# Patient Record
Sex: Female | Born: 2000 | Race: White | Hispanic: No | Marital: Single | Smoking: Never smoker
Health system: Southern US, Community
[De-identification: ages and names within clinical notes are randomized; demographics above are authoritative.]

## PROBLEM LIST (undated history)

## (undated) DIAGNOSIS — S060XAA Concussion with loss of consciousness status unknown, initial encounter: Secondary | ICD-10-CM

## (undated) DIAGNOSIS — S060X9A Concussion with loss of consciousness of unspecified duration, initial encounter: Secondary | ICD-10-CM

---
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
## 2021-09-12 ENCOUNTER — Telehealth: Payer: Self-pay

## 2021-09-12 NOTE — Telephone Encounter (Signed)
Nurse Assessment Nurse: Kizzie Bane, RN, Marylene Land Date/Time Lamount Cohen Time): 09/12/2021 12:24:36 PM Confirm and document reason for call. If symptomatic, describe symptoms. ---Caller states she has been having rectal bleeding for a week. No fever   Does the patient have any new or worsening symptoms? ---Yes Will a triage be completed? ---Yes Related visit to physician within the last 2 weeks? ---No Does the PT have any chronic conditions? (i.e. diabetes, asthma, this includes High risk factors for pregnancy, etc.) ---No Is the patient pregnant or possibly pregnant? (Ask all females between the ages of 87-55) ---No Is this a behavioral health or substance abuse call? ---No  Rectal Bleeding  [1] MODERATE rectal bleeding (small blood clots, passing blood without stool, or toilet water turns red)  [2] more than once a day Leanord Hawking 09/12/2021 12:25:58 PM  09/12/2021 12:28:05 PM Go to ED Now Yes Kizzie Bane, RN, Marylene Land

## 2021-09-13 NOTE — Telephone Encounter (Signed)
Fyi.

## 2021-10-04 ENCOUNTER — Encounter: Payer: Self-pay | Admitting: Family Medicine

## 2021-10-04 ENCOUNTER — Ambulatory Visit (INDEPENDENT_AMBULATORY_CARE_PROVIDER_SITE_OTHER): Payer: 59 | Admitting: Family Medicine

## 2021-10-04 ENCOUNTER — Other Ambulatory Visit: Payer: Self-pay

## 2021-10-04 VITALS — BP 120/68 | HR 87 | Temp 97.6°F | Ht 69.0 in | Wt 125.6 lb

## 2021-10-04 DIAGNOSIS — G8929 Other chronic pain: Secondary | ICD-10-CM | POA: Diagnosis not present

## 2021-10-04 DIAGNOSIS — R1010 Upper abdominal pain, unspecified: Secondary | ICD-10-CM

## 2021-10-04 DIAGNOSIS — F411 Generalized anxiety disorder: Secondary | ICD-10-CM | POA: Insufficient documentation

## 2021-10-04 DIAGNOSIS — K625 Hemorrhage of anus and rectum: Secondary | ICD-10-CM | POA: Diagnosis not present

## 2021-10-04 NOTE — Patient Instructions (Signed)
Please return in 3-6 months for your annual check up.   We will call you with information regarding your referral appointment. Gastroenterology consultation.  If you do not hear from [place] within the next 2 weeks, please let me know. It can take 1-2 weeks to get appointments set up with the specialists.    If you have any questions or concerns, please don't hesitate to send me a message via MyChart or call the office at [phone]. Thank you for visiting with [place] today! It's our pleasure caring for you.   Rectal Bleeding Rectal bleeding is when blood passes out of the opening between the buttocks (anus). People with rectal bleeding may notice bright red blood in their underwear or in the toilet after having a bowel movement. They may also have blood mixed with their stool (feces), or dark red or black stools. Rectal bleeding is usually a sign that something is wrong. Many things can cause rectal bleeding, including: Diverticulosis. This is a condition in which pockets or sacs project from the bowel. Hemorrhoids. These are blood vessels around the anus or inside the rectum that are larger than normal. Anal fissures. This is a tear in the anus. Proctitis and colitis. These are conditions in which the rectum, colon, or anus become inflamed. Polyps. These are growths that can be cancerous (malignant) or noncancerous (benign). Infections of the intestines. Fistulas. These are abnormal openings in the rectum and anus. Rectal prolapse. This is when a part of the rectum sticks out from the anus. Follow these instructions at home: Pay attention to any changes in your symptoms. Take these actions to help reduce bleeding and discomfort: Medicines Take over-the-counter and prescription medicines only as told by your health care provider. Ask your health care provider about changing or stopping your regular medicines or supplements. This is especially important if you are taking blood thinners. Medicines that  thin the blood can make rectal bleeding worse. Managing constipation Your condition may cause constipation. To prevent or treat constipation, or to help make your stools soft, you may need to: Drink enough fluid to keep your urine pale yellow. Take over-the-counter or prescription medicines. Eat foods that are high in fiber, such as beans, whole grains, and fresh fruits and vegetables. Ask your health care provider if you need a supplement to give you more fiber. Limit foods that are high in fat and processed sugars, such as fried or sweet foods.  General instructions Try not to strain when having a bowel movement. Try taking a warm bath. This may help to soothe any pain in your rectum. Keep all follow-up visits as told by your health care provider. This is important. Contact a health care provider if you: Have pain or tenderness in your abdomen. Have a fever. Have weakness. Have nausea. Cannot have a bowel movement. Get help right away if you have: New or increased rectal bleeding. Black or dark red stools. Vomit with blood or something that looks like coffee grounds. A fainting episode. Severe pain in your rectum. Summary Rectal bleeding is usually a sign that something is wrong. This condition should be evaluated by a health care provider. Eat a diet that is high in fiber. This will help keep your stools soft, making it easier to pass stools without straining. Medicines that thin the blood can make rectal bleeding worse. Get help right away if you have new or increased rectal bleeding, black or dark red stools, blood in your vomit, an episode of fainting, or severe  pain in your rectum. This information is not intended to replace advice given to you by your health care provider. Make sure you discuss any questions you have with your health care provider. Document Revised: 08/26/2019 Document Reviewed: 08/26/2019 Elsevier Patient Education  2022 [employer].

## 2021-10-04 NOTE — Progress Notes (Signed)
Subjective  CC:  Chief Complaint  Patient presents with   Rectal Bleeding    Started in November, went to ED 12/2. No blood in stool yesterday.   Abdominal Pain    Left side toward center and burns sensation inside.   Headache    Sharp pains , Left side    HPI: Laura Wilkins is a 20 y.o. female who presents to the office today to address the problems listed above in the chief complaint. 20 year old female here for rectal bleeding.  I reviewed ER evaluation from 12 June.  20 year old who has had problems with her stomach for several years, mostly describes pain with eating.  Has been a picky eater.  However over Thanksgiving holiday she had to days in a row with bright red rectal bleeding.  Some with wiping, some dripping into the toilet bowl.  Stool was soft and brown and normal.  That time.  She is not sure if she saw mucus in the bowl at that time.  She had no rectal pain.  She did have some crampy upper abdominal pain but this is not uncommon for her.  No fevers or chills.  No change in appetite.  No unwanted weight changes.  Emergency room evaluation was unremarkable except for an external hemorrhoid noted on exam that was nonbleeding.  CBC was normal.  Over the last several weeks she felt fine until yesterday when she again had bright rectal bleeding however, this time she reports blood was mixed in stool that was dark.  She admits to some crampy upper abdominal pain but no significant localized abdominal pain.  No fevers, chills, lightheadedness, palpitations.  No family history of inflammatory bowel disease.  She does suffer from chronic anxiety and is now on Wellbutrin.  No pain with defecation.  Assessment  1. Rectal bleeding   2. Chronic upper abdominal pain   3. GAD (generalized anxiety disorder)      Plan  Rectal bleeding: None patient with external hemorrhoid but anoscopy exam otherwise unremarkable.  Chronic upper abdominal pain but perhaps worse cramping recently.  Possible  mucoid stool but history is unreliable.  Given recurrent rectal bleeding, recommend GI referral for further evaluation.  Uncertain if warrants colonoscopy.  Reassured overall.  Benign abdominal exam.  Discussed what to do if symptoms worsen.  Supportive care in the interim. Chronic upper abdominal pain: Possibly gastritis.  Patient wishes to go across with gastroenterology.  No medical treatment at this time. General anxiety disorder per mental health clinic at [facility].  On Wellbutrin. Overdue for wellness exam.  Follow up: 3 months for complete physical Visit date not found  Orders Placed This Encounter  Procedures   Ambulatory referral to Gastroenterology   No orders of the defined types were placed in this encounter.     I reviewed the patients updated PMH, FH, and SocHx.    Patient Active Problem List   Diagnosis Date Noted   GAD (generalized anxiety disorder) 10/04/2021   Acne vulgaris 11/30/2018   Birth control counseling 11/30/2018   Depression, major, single episode, moderate (HCC) 11/30/2018   History of concussion 07/10/2018   Current Meds  Medication Sig   buPROPion (WELLBUTRIN XL) 150 MG 24 hr tablet Take 150 mg by mouth daily.   tretinoin (RETIN-A) 0.025 % gel Apply topically at bedtime.   TRI-SPRINTEC 0.18/0.215/0.25 MG-35 MCG tablet TAKE ONE TABLET BY MOUTH DAILY   triamcinolone cream (KENALOG) 0.1 % Apply 1 application topically 2 (two) times daily. For 2  weeks, then as needed    Allergies: Patient has No Known Allergies. Family History: Patient family history is not on file. Social History:  Patient  reports that she has never smoked. She has never used smokeless tobacco. She reports that she does not drink alcohol and does not use drugs.  Review of Systems: Constitutional: Negative for fever malaise or anorexia Cardiovascular: negative for chest pain Respiratory: negative for SOB or persistent cough Gastrointestinal: negative for abdominal pain  Objective   Vitals: BP 120/68    Pulse 87    Temp 97.6 F (36.4 C) (Temporal)    Ht 5\' 9"  (1.753 m)    Wt 125 lb 9.6 oz (57 kg)    LMP 09/25/2021 (Approximate)    SpO2 98%    BMI 18.55 kg/m  General: no acute distress , A&Ox3, thin HEENT: PEERL, conjunctiva normal, neck is supple Cardiovascular:  RRR without murmur or gallop.  Respiratory:  Good breath sounds bilaterally, CTAB with normal respiratory effort Gastrointestinal: soft, flat abdomen, normal active bowel sounds, no palpable masses, no hepatosplenomegaly, no appreciated hernias Midepigastric and left upper quadrant tenderness, right lower quadrant tenderness, no rebound or guarding or masses. Rectal exam: Normal sphincter tone, nonbleeding external hemorrhoid visualized, nontender, guaiac negative stool Anoscopy: Normal-appearing. Skin:  Warm, no rashes  Procedure note: Anoscopy External rectum visualized Anoscope gently inserted into the rectum and then withdrawn under visualization Patient tolerated procedure well  Commons side effects, risks, benefits, and alternatives for medications and treatment plan prescribed today were discussed, and the patient expressed understanding of the given instructions. Patient is instructed to call or message via MyChart if he/she has any questions or concerns regarding our treatment plan. No barriers to understanding were identified. We discussed Red Flag symptoms and signs in detail. Patient expressed understanding regarding what to do in case of urgent or emergency type symptoms.  Medication list was reconciled, printed and provided to the patient in AVS. Patient instructions and summary information was reviewed with the patient as documented in the AVS. This note was prepared with assistance of Dragon voice recognition software. Occasional wrong-word or sound-a-like substitutions may have occurred due to the inherent limitations of voice recognition software  This visit occurred during the SARS-CoV-2  public health emergency.  Safety protocols were in place, including screening questions prior to the visit, additional usage of staff PPE, and extensive cleaning of exam room while observing appropriate contact time as indicated for disinfecting solutions.

## 2021-10-06 ENCOUNTER — Emergency Department (HOSPITAL_COMMUNITY): Payer: 59

## 2021-10-06 ENCOUNTER — Encounter (HOSPITAL_COMMUNITY): Payer: Self-pay | Admitting: Emergency Medicine

## 2021-10-06 ENCOUNTER — Other Ambulatory Visit: Payer: Self-pay

## 2021-10-06 ENCOUNTER — Emergency Department (HOSPITAL_COMMUNITY)
Admission: EM | Admit: 2021-10-06 | Discharge: 2021-10-06 | Disposition: A | Discharge status: Home / Self Care | Payer: 59 | Attending: Emergency Medicine | Admitting: Emergency Medicine

## 2021-10-06 DIAGNOSIS — N200 Calculus of kidney: Secondary | ICD-10-CM | POA: Diagnosis not present

## 2021-10-06 DIAGNOSIS — R1011 Right upper quadrant pain: Secondary | ICD-10-CM | POA: Diagnosis present

## 2021-10-06 DIAGNOSIS — R109 Unspecified abdominal pain: Secondary | ICD-10-CM

## 2021-10-06 LAB — URINALYSIS, ROUTINE W REFLEX MICROSCOPIC
Bacteria, UA: NONE SEEN
Bilirubin Urine: NEGATIVE
Glucose, UA: NEGATIVE mg/dL
Ketones, ur: NEGATIVE mg/dL
Leukocytes,Ua: NEGATIVE
Nitrite: NEGATIVE
Protein, ur: NEGATIVE mg/dL
Specific Gravity, Urine: 1.01 (ref 1.005–1.030)
pH: 6 (ref 5.0–8.0)

## 2021-10-06 LAB — COMPREHENSIVE METABOLIC PANEL WITH GFR
ALT: 14 U/L (ref 0–44)
AST: 20 U/L (ref 15–41)
Albumin: 4.2 g/dL (ref 3.5–5.0)
Alkaline Phosphatase: 65 U/L (ref 38–126)
Anion gap: 10 (ref 5–15)
BUN: 11 mg/dL (ref 6–20)
CO2: 20 mmol/L — ABNORMAL LOW (ref 22–32)
Calcium: 9.3 mg/dL (ref 8.9–10.3)
Chloride: 107 mmol/L (ref 98–111)
Creatinine, Ser: 0.75 mg/dL (ref 0.44–1.00)
GFR, Estimated: >60 mL/min
Glucose, Bld: 96 mg/dL (ref 70–99)
Potassium: 3.5 mmol/L (ref 3.5–5.1)
Sodium: 137 mmol/L (ref 135–145)
Total Bilirubin: 1 mg/dL (ref 0.3–1.2)
Total Protein: 7.3 g/dL (ref 6.5–8.1)

## 2021-10-06 LAB — CBC WITH DIFFERENTIAL/PLATELET
Abs Immature Granulocytes: 0.02 K/uL (ref 0.00–0.07)
Basophils Absolute: 0 K/uL (ref 0.0–0.1)
Basophils Relative: 0 %
Eosinophils Absolute: 0.1 K/uL (ref 0.0–0.5)
Eosinophils Relative: 1 %
HCT: 40 % (ref 36.0–46.0)
Hemoglobin: 13.6 g/dL (ref 12.0–15.0)
Immature Granulocytes: 0 %
Lymphocytes Relative: 35 %
Lymphs Abs: 2.7 K/uL (ref 0.7–4.0)
MCH: 32.8 pg (ref 26.0–34.0)
MCHC: 34 g/dL (ref 30.0–36.0)
MCV: 96.4 fL (ref 80.0–100.0)
Monocytes Absolute: 0.7 K/uL (ref 0.1–1.0)
Monocytes Relative: 9 %
Neutro Abs: 4.3 K/uL (ref 1.7–7.7)
Neutrophils Relative %: 55 %
Platelets: 283 K/uL (ref 150–400)
RBC: 4.15 MIL/uL (ref 3.87–5.11)
RDW: 11.5 % (ref 11.5–15.5)
WBC: 7.8 K/uL (ref 4.0–10.5)
nRBC: 0 % (ref 0.0–0.2)

## 2021-10-06 LAB — LIPASE, BLOOD: Lipase: 45 U/L (ref 11–51)

## 2021-10-06 LAB — I-STAT BETA HCG BLOOD, ED (MC, WL, AP ONLY): I-stat hCG, quantitative: <5 m[IU]/mL

## 2021-10-06 MED ORDER — IOHEXOL 300 MG/ML  SOLN
80.0000 mL | Freq: Once | INTRAMUSCULAR | Status: AC | PRN
  Administered 2021-10-06: 04:00:00 80 mL via INTRAVENOUS

## 2021-10-06 NOTE — ED Triage Notes (Signed)
Patient reports bilateral low abdominal pain with bloody stools onset yesterday , no emesis or fever .

## 2021-10-06 NOTE — ED Provider Notes (Signed)
Emergency Medicine Provider Triage Evaluation Note  Laura Wilkins , a 20 y.o. female  was evaluated in triage.  Pt complains of lower abdominal pain.  Initially LLQ now RLQ with some bloody stools.  Has been ongoing for a while, worse now.  Seen at ED previously and had labs but refused CT.  Saw PCP yesterday, no work-up done.  Denies hx of IBS, crohn's, ulcerative colitis, etc.  Denies urinary symptoms, vaginal complaints..  Review of Systems  Positive: Lower abdominal pain, bloody stools Negative: fever  Physical Exam  BP (!) 156/112    Pulse (!) 121    Temp 98.1 F (36.7 C) (Oral)    Resp 18    LMP 09/25/2021 (Approximate)    SpO2 100%   Gen:   Awake, no distress   Resp:  Normal effort  MSK:   Moves extremities without difficulty  Other:  Lower abdominal pain   Medical Decision Making  Medically screening exam initiated at 12:39 AM.  Appropriate orders placed.  Samuel Rittenhouse was informed that the remainder of the evaluation will be completed by another provider, this initial triage assessment does not replace that evaluation, and the importance of remaining in the ED until their evaluation is complete.  Lower abdominal pain, bloody stools-- ongoing but worse now.  Will check labs, CT.   Garlon Hatchet, PA-C 10/06/21 0041    Marily Memos, MD 10/06/21 [phone]

## 2021-10-06 NOTE — ED Provider Notes (Signed)
[facility] EMERGENCY DEPARTMENT Provider Note   CSN: [id number] Arrival date & time: 10/06/21  0031     History Chief Complaint  Patient presents with   Abdominal Pain    Hematochezia    Brandice Busser is a 20 y.o. female.  HPI 20 year old female G59, LMP several days ago, periods are regular, presents today complaining of some ongoing left-sided pain followed by some more recent right-sided abdominal pain.  She reports some blood in her stools.  She also reports some pain with intercourse.  She denies abnormal vaginal discharge.  Has not had fever, chills, urinary tract and symptoms infection symptoms.    Past Medical History:  Diagnosis Date   Concussion     Patient Active Problem List   Diagnosis Date Noted   GAD (generalized anxiety disorder) 10/04/2021   Acne vulgaris 11/30/2018   Birth control counseling 11/30/2018   Depression, major, single episode, moderate ([place]) 11/30/2018   History of concussion 07/10/2018    History reviewed. No pertinent surgical history.   OB History   No obstetric history on file.     History reviewed. No pertinent family history.  Social History   Tobacco Use   Smoking status: Never   Smokeless tobacco: Never  Substance Use Topics   Alcohol use: No   Drug use: No    Home Medications Prior to Admission medications   Medication Sig Start Date End Date Taking? Authorizing Provider  buPROPion (WELLBUTRIN XL) 150 MG 24 hr tablet Take 150 mg by mouth daily.    [provider]  tretinoin (RETIN-A) 0.025 % gel Apply topically at bedtime. 02/29/20   Leamon Arnt, MD  TRI-SPRINTEC 0.18/0.215/0.25 MG-35 MCG tablet TAKE ONE TABLET BY MOUTH DAILY 10/12/20   Leamon Arnt, MD  triamcinolone cream (KENALOG) 0.1 % Apply 1 application topically 2 (two) times daily. For 2 weeks, then as needed 01/20/20   Leamon Arnt, MD    Allergies    Patient has no known allergies.  Review of Systems   Review of  Systems  All other systems reviewed and are negative.  Physical Exam Updated Vital Signs BP 112/64    Pulse (!) 52    Temp 98.1 F (36.7 C) (Oral)    Resp 15    Ht 1.753 m (_0 )    Wt 62 kg    LMP 09/25/2021 (Approximate)    SpO2 100%    BMI 20.18 kg/m   Physical Exam Vitals and nursing note reviewed.  Constitutional:      General: She is not in acute distress.    Appearance: She is well-developed.  HENT:     Head: Normocephalic.     Mouth/Throat:     Mouth: Mucous membranes are moist.  Eyes:     Extraocular Movements: Extraocular movements intact.  Cardiovascular:     Rate and Rhythm: Normal rate and regular rhythm.  Pulmonary:     Effort: Pulmonary effort is normal.     Breath sounds: Normal breath sounds.  Abdominal:     General: Bowel sounds are normal.     Tenderness: There is abdominal tenderness in the right upper quadrant and right lower quadrant. There is no right CVA tenderness, left CVA tenderness, guarding or rebound. Negative signs include Murphy's sign, Rovsing's sign and psoas sign.     Hernia: No hernia is present.  Skin:    Capillary Refill: Capillary refill takes less than 2 seconds.  Neurological:     General:  No focal deficit present.     Mental Status: She is alert.  Psychiatric:        Mood and Affect: Mood normal.        Behavior: Behavior normal.    ED Results / Procedures / Treatments   Labs (all labs ordered are listed, but only abnormal results are displayed) Labs Reviewed  COMPREHENSIVE METABOLIC PANEL - Abnormal; Notable for the following components:      Result Value   CO2 20 (*)    All other components within normal limits  URINALYSIS, ROUTINE W REFLEX MICROSCOPIC - Abnormal; Notable for the following components:   Hgb urine dipstick SMALL (*)    All other components within normal limits  CBC WITH DIFFERENTIAL/PLATELET  LIPASE, BLOOD  I-STAT BETA HCG BLOOD, ED ([facility], [facility], [facility] ONLY)    EKG None  Radiology CT ABDOMEN PELVIS W  CONTRAST  Result Date: 10/06/2021 CLINICAL DATA:  Left lower quadrant and right lower quadrant pain with bloody stools. EXAM: CT ABDOMEN AND PELVIS WITH CONTRAST TECHNIQUE: Multidetector CT imaging of the abdomen and pelvis was performed using the standard protocol following bolus administration of intravenous contrast. CONTRAST:  47m OMNIPAQUE IOHEXOL 300 MG/ML  SOLN COMPARISON:  None. FINDINGS: Lower chest:  No contributory findings. Hepatobiliary: No focal liver abnormality.No evidence of biliary obstruction or stone. Pancreas: Unremarkable. Spleen: Unremarkable. Adrenals/Urinary Tract: Negative adrenals. No hydronephrosis or ureteral stone. 3 mm right renal calculus. Unremarkable bladder. Stomach/Bowel: No obstruction. No appendicitis or other visible bowel inflammation. Vascular/Lymphatic: No acute vascular abnormality. No mass or adenopathy. Reproductive:No pathologic findings. Other: No ascites or pneumoperitoneum. Musculoskeletal: No acute abnormalities. IMPRESSION: No acute finding. 3 mm right renal calculus. Electronically Signed   By: JJorje GuildM.D.   On: 10/06/2021 05:01    Procedures Procedures   Medications Ordered in ED Medications  iohexol (OMNIPAQUE) 300 MG/ML solution 80 mL (80 mLs Intravenous Contrast Given 10/06/21 0429)    ED Course  I have reviewed the triage vital signs and the nursing notes.  Pertinent labs & imaging results that were available during my care of the patient were reviewed by me and considered in my medical decision making (see chart for details).    MDM Rules/Calculators/A&P                         20year old female with mild diffuse abdominal pain with differential diagnosis that includes kidney disease, infection, kidney stones, gallbladder, appendicitis, diseases of the female reproductive system, diseases of the stomach, pancreas, and bowel.  Patient was evaluated here with labs.  Labs were ordered prior to my evaluation.  I did review and  interpret labs.  CBC is within normal limits C-Met was within normal limits the exception of mild decreased CO2 on initial evaluation.  Chemistries attic, electrolytes, LFTs, lipase, and liver functions all are normal Urinalysis is significant for small hemoglobin with 0-5 red blood cells and 0-5 white blood cells with no bacteria nitrate negative Pregnancy test is negative CT was personally reviewed and there is no evidence of acute intra-abdominal pathology with the exception of a right millimeter renal calculus. Discussed all the above with patient and her mother Possible etiologies for her current pain are ureteral stone, intermittent obstruction from the right renal stone, or other diseases of the uterus and adnexa, colon, such as bowel syndrome or inflammatory bowel disease. Given patient is sexually active and has irregular periods, plan referral to GYN Patient has primary care doctor  and she is advised regarding follow-up. Given the patient has a kidney stone.  Discussed signs symptoms of kidney stone formation.  And given her age with the microcytic hematuria, will refer to urology. We discursive discussed return precautions and need for follow-up and she and her mother voiced understanding.   Final Clinical Impression(s) / ED Diagnoses Final diagnoses:  Abdominal pain, unspecified abdominal location  Kidney stone    Rx / DC Orders ED Discharge Orders     None        Pattricia Boss, MD 10/06/21 [phone]

## 2021-10-06 NOTE — Discharge Instructions (Signed)
Please follow-up with your primary care doctor, urology, and gynecology. Return if you are having worsening pain or other new symptoms. Your CT scan and labs here today were all essentially normal with the exception of the kidney stone that was seen.

## 2022-02-15 ENCOUNTER — Encounter: Payer: Self-pay | Admitting: Family Medicine

## 2022-02-21 ENCOUNTER — Ambulatory Visit (INDEPENDENT_AMBULATORY_CARE_PROVIDER_SITE_OTHER): Payer: Commercial Managed Care - HMO | Admitting: Family Medicine

## 2022-02-21 ENCOUNTER — Encounter: Payer: Self-pay | Admitting: Family Medicine

## 2022-02-21 VITALS — BP 100/76 | HR 102 | Temp 98.8°F | Ht 69.0 in | Wt 125.2 lb

## 2022-02-21 DIAGNOSIS — I7300 Raynaud's syndrome without gangrene: Secondary | ICD-10-CM | POA: Diagnosis not present

## 2022-02-21 DIAGNOSIS — F988 Other specified behavioral and emotional disorders with onset usually occurring in childhood and adolescence: Secondary | ICD-10-CM

## 2022-02-21 DIAGNOSIS — F411 Generalized anxiety disorder: Secondary | ICD-10-CM

## 2022-02-21 DIAGNOSIS — R1010 Upper abdominal pain, unspecified: Secondary | ICD-10-CM

## 2022-02-21 DIAGNOSIS — N200 Calculus of kidney: Secondary | ICD-10-CM | POA: Diagnosis not present

## 2022-02-21 DIAGNOSIS — G8929 Other chronic pain: Secondary | ICD-10-CM

## 2022-02-21 LAB — CBC WITH DIFFERENTIAL/PLATELET
Basophils Absolute: 0 10*3/uL (ref 0.0–0.1)
Basophils Relative: 0.7 % (ref 0.0–3.0)
Eosinophils Absolute: 0 10*3/uL (ref 0.0–0.7)
Eosinophils Relative: 0.7 % (ref 0.0–5.0)
HCT: 41.9 % (ref 36.0–49.0)
Hemoglobin: 13.9 g/dL (ref 12.0–16.0)
Lymphocytes Relative: 33.3 % (ref 24.0–48.0)
Lymphs Abs: 1.8 10*3/uL (ref 0.7–4.0)
MCHC: 33.2 g/dL (ref 31.0–37.0)
MCV: 97.5 fl (ref 78.0–98.0)
Monocytes Absolute: 0.4 10*3/uL (ref 0.1–1.0)
Monocytes Relative: 7.3 % (ref 3.0–12.0)
Neutro Abs: 3.1 10*3/uL (ref 1.4–7.7)
Neutrophils Relative %: 58 % (ref 43.0–71.0)
Platelets: 259 10*3/uL (ref 150.0–575.0)
RBC: 4.3 Mil/uL (ref 3.80–5.70)
RDW: 12 % (ref 11.4–15.5)
WBC: 5.3 10*3/uL (ref 4.5–13.5)

## 2022-02-21 LAB — COMPREHENSIVE METABOLIC PANEL
ALT: 10 U/L (ref 0–35)
AST: 17 U/L (ref 0–37)
Albumin: 4.1 g/dL (ref 3.5–5.2)
Alkaline Phosphatase: 52 U/L (ref 47–119)
BUN: 12 mg/dL (ref 6–23)
CO2: 23 mEq/L (ref 19–32)
Calcium: 10 mg/dL (ref 8.4–10.5)
Chloride: 104 mEq/L (ref 96–112)
Creatinine, Ser: 0.81 mg/dL (ref 0.40–1.20)
GFR: 104.94 mL/min (ref >60.00)
Glucose, Bld: 91 mg/dL (ref 70–99)
Potassium: 5.1 mEq/L (ref 3.5–5.1)
Sodium: 136 mEq/L (ref 135–145)
Total Bilirubin: 0.8 mg/dL (ref 0.2–1.2)
Total Protein: 7.7 g/dL (ref 6.0–8.3)

## 2022-02-21 LAB — TSH: TSH: 1.67 u[IU]/mL (ref 0.40–5.00)

## 2022-02-21 LAB — SEDIMENTATION RATE: Sed Rate: 15 mm/hr (ref 0–20)

## 2022-02-21 LAB — C-REACTIVE PROTEIN: CRP: <1 mg/dL (ref 0.5–20.0)

## 2022-02-21 NOTE — Patient Instructions (Addendum)
Please contact [facility] to schedule a consult for persistent abdominal pain.  ?[phone] ? ?Continue care with your psychiatrist and start counseling as recommended.  ? ?Please sign a release of records for the [facility] records OR send the copies you have in your mychart or print them out for my review.  ? ?If you have any questions or concerns, please don't hesitate to send me a message via MyChart or call the office at [phone]. Thank you for visiting with [place] today! It's our pleasure caring for you.  ? ?Raynaud's Phenomenon ? ?Raynaud's phenomenon is a condition that affects the blood vessels (arteries) that carry blood to the fingers and toes. The arteries that supply blood to the ears, lips, nipples, or the tip of the nose might also be affected. Raynaud's phenomenon causes the arteries to become narrow temporarily (spasm). As a result, the flow of blood to the affected areas is temporarily decreased. This usually occurs in response to cold temperatures or stress. During an attack, the skin in the affected areas turns white, then blue, and finally red. A person may also feel tingling or numbness in those areas. ?Attacks usually last for only a brief period, and then the blood flow to the area returns to normal. In most cases, Raynaud's phenomenon does not cause serious health problems. ?What are the causes? ?In many cases, the cause of this condition is not known. The condition may occur on its own (primary Raynaud's phenomenon) or may be associated with other diseases or factors (secondary Raynaud's phenomenon). ?Possible causes may include: ?Diseases or medical conditions that damage the arteries. ?Injuries and repetitive actions that hurt the hands or feet. ?Being exposed to certain chemicals. ?Taking medicines that narrow the arteries. ?Other medical conditions, such as lupus, scleroderma, rheumatoid arthritis, thyroid problems, blood disorders, Sjogren syndrome, or atherosclerosis. ?What increases the  risk? ?The following factors may make you more likely to develop this condition: ?Being 71-28 years old. ?Being female. ?Having a family history of Raynaud's phenomenon. ?Living in a cold climate. ?Smoking. ?What are the signs or symptoms? ?Symptoms of this condition usually occur when you are exposed to cold temperatures or when you have emotional stress. The symptoms may last for a few minutes or up to several hours. They usually affect your fingers but may also affect your toes, nipples, lips, ears, or the tip of your nose. Symptoms may include: ?Changes in skin color. The skin in the affected areas will turn pale or white. The skin may then change from white to bluish to red as normal blood flow returns to the area. ?Numbness, tingling, or pain in the affected areas. ?In severe cases, symptoms may include: ?Skin sores. ?Tissues decaying and dying (gangrene). ?How is this diagnosed? ?This condition may be diagnosed based on: ?Your symptoms and medical history. ?A physical exam. During the exam, you may be asked to put your hands in cold water to check for a reaction to cold temperature. ?Tests, such as: ?Blood tests to check for other diseases or conditions. ?A test to check the movement of blood through your arteries and veins (vascular ultrasound). ?A test in which the skin at the base of your fingernail is examined under a microscope (nailfold capillaroscopy). ?How is this treated? ?During an episode, you can take actions to help symptoms go away faster. Options include moving your arms around in a windmill pattern, warming your fingers under warm water, or placing your fingers in a warm body fold, such as your armpit. ?Long-term treatment  for this condition often involves making lifestyle changes and taking steps to control your exposure to cold temperature. For more severe cases, medicine (calcium channel blockers) may be used to improve blood circulation. ?Follow these instructions at home: ?Avoiding cold  temperatures ?Take these steps to avoid exposure to cold: ?If possible, stay indoors during cold weather. ?When you go outside during cold weather, dress in layers and wear mittens, a hat, a scarf, and warm footwear. ?Wear mittens or gloves when handling ice or frozen food. ?Use holders for glasses or cans containing cold drinks. ?Let warm water run for a while before taking a shower or bath. ?Warm up the car before driving in cold weather. ?Lifestyle ?If possible, avoid stressful and emotional situations. Try to find ways to manage your stress, such as: ?Exercise. ?Yoga. ?Meditation. ?Biofeedback. ?Do not use any products that contain nicotine or tobacco. These products include cigarettes, chewing tobacco, and vaping devices, such as e-cigarettes. If you need help quitting, ask your health care provider. ?Avoid secondhand smoke. ?Limit your use of caffeine. ?Switch to decaffeinated coffee, tea, and soda. ?Avoid chocolate. ?Avoid vibrating tools and machinery. ?General instructions ?Protect your hands and feet from injuries, cuts, or bruises. ?Avoid wearing tight rings or wristbands. ?Wear loose fitting socks and comfortable, roomy shoes. ?Take over-the-counter and prescription medicines only as told by your health care provider. ?Where to find support ?Raynaud's Association: www.raynauds.org ?Where to find more information ?[employer] of Arthritis and Musculoskeletal and Skin Diseases: www.niams.SouthExposed.es ?Contact a health care provider if: ?Your discomfort becomes worse despite lifestyle changes. ?You develop sores on your fingers or toes that do not heal. ?You have breaks in the skin on your fingers or toes. ?You have a fever. ?You have pain or swelling in your joints. ?You have a rash. ?Your symptoms occur on only one side of your body. ?Get help right away if: ?Your fingers or toes turn black. ?You have severe pain in the affected areas. ?These symptoms may represent a serious problem that is an  emergency. Do not wait to see if the symptoms will go away. Get medical help right away. Call your local emergency services (911 in the [place] Do not drive yourself to the hospital. ?Summary ?Raynaud's phenomenon is a condition that affects the arteries that carry blood to the fingers, toes, ears, lips, nipples, or the tip of the nose. ?In many cases, the cause of this condition is not known. ?Symptoms of this condition include changes in skin color along with numbness and tingling in the affected area. ?Treatment for this condition includes lifestyle changes and reducing exposure to cold temperatures. Medicines may be used for severe cases of the condition. ?Contact your health care provider if your condition worsens despite treatment. ?This information is not intended to replace advice given to you by your health care provider. Make sure you discuss any questions you have with your health care provider. ?Document Revised: 11/29/2020 Document Reviewed: 11/29/2020 ?Elsevier Patient Education ? [place]. ? ?

## 2022-02-21 NOTE — Progress Notes (Signed)
Subjective  CC:  Chief Complaint  Patient presents with   Annual Exam    Pt here for Annual Exam and is currently fast. Stomach pain since February and thinks it kidney stone    HPI: Laura Wilkins is a 21 y.o. female who presents to the office today to address the problems listed above in the chief complaint. 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 Patient reports that she has had a difficult semester at school.  Had chronic moderate to severe intermittent episodes of abdominal pain.  She reports that student health care center diagnosed her with kidney stones.  However the pain is ongoing over the last 2-1/2 months.  I cannot see any documentation or studies although patient reports she has had a CT scan in multiple blood test.  She never has been referred to urology.  Her history is complex Has history of chronic upper abdominal pain but also reports bouts of abdominal pain in different places.  She was referred to gastroenterology back in December because she also had associated rectal bleeding.  She never made an appointment with gastroenterology.  Abdominal pain, although intermittent, persist.  She denies fevers chills.  She reports she is a very picky part due to her abdominal symptoms.  She denies diarrhea.  She is stating that her weight is stable although this is very intentional with her diet. Kidney stones: She reports she was told she had small kidney stones in her kidney but not necessarily in her pelvis.  No current pain for the last 4-1/2 months. She is now seeing a psychiatrist due to anxiety symptoms.  They are undergoing a work-up for ADD and she was started on Focalin.  She has longstanding stress symptoms.  Counseling has been recommended in the past but she has never followed through.  Her current psychiatrist is recommended counseling. She reports a  new problem: At times distal fingertips will turn completely white and will lose feeling.  Necessarily related to cold. Review of systems negative for fevers, chills, joint pain, rash  Assessment  1. Chronic upper abdominal pain   2. Nephrolithiasis   3. Attention deficit disorder (ADD) without hyperactivity   4. GAD (generalized anxiety disorder)   5. Raynaud's phenomenon without gangrene      Plan  Chronic abdominal pain: Recommend gastroenterology consult to further help clarify etiology.  Likely multifactorial.  Patient to schedule appointment. Nephrolithiasis: Clinical history is not consistent with acute obstructive symptoms.  We did get records from [employer] [facility] [employer] student health care.  Release of records signed today in our office.  Patient will try to upload her labs and CT scans to her MyChart page.  May need urology consult. ADD and anxiety: Per psychiatry.  I do recommend counseling.  Possible eating disorder as well. Raynaud's phenomenon: She did show me pictures and the changes are striking.  Possibly stress-induced.  Check lab work for autoimmune problems.  I spent a total of 54 minutes for this patient encounter. Time spent included preparation, face-to-face counseling with the patient and coordination of care, review of chart and records, and documentation of the encounter.  Follow up: 3 months for recheck 06/01/2022  Orders Placed This Encounter  Procedures   CBC with Differential/Platelet   Comprehensive metabolic panel   TSH   C-reactive protein   Sedimentation rate   ANA   No orders of the defined types  were placed in this encounter.     I reviewed the patients updated PMH, FH, and SocHx.    Patient Active Problem List   Diagnosis Date Noted   Raynaud's phenomenon without gangrene 02/21/2022   Attention deficit disorder (ADD) without hyperactivity 02/21/2022   Nephrolithiasis 02/21/2022   Chronic upper abdominal pain 02/21/2022   GAD (generalized anxiety disorder)  10/04/2021   Acne vulgaris 11/30/2018   Depression, major, single episode, moderate (HCC) 11/30/2018   History of concussion 07/10/2018   Current Meds  Medication Sig   dexmethylphenidate (FOCALIN XR) 10 MG 24 hr capsule Take 20 mg by mouth daily.   FLONASE SENSIMIST 27.5 MCG/SPRAY nasal spray 2 sprays daily.   hydrOXYzine (VISTARIL) 25 MG capsule Take 25-50 mg by mouth every 6 (six) hours as needed.   triamcinolone cream (KENALOG) 0.1 % Apply 1 application topically 2 (two) times daily. For 2 weeks, then as needed    Allergies: Patient has No Known Allergies. Family History: Patient family history is not on file. Social History:  Patient  reports that she has never smoked. She has never used smokeless tobacco. She reports that she does not drink alcohol and does not use drugs.  Review of Systems: Constitutional: Negative for fever malaise or anorexia Cardiovascular: negative for chest pain Respiratory: negative for SOB or persistent cough Gastrointestinal: negative for abdominal pain  Objective  Vitals: BP 100/76   Pulse (!) 102   Temp 98.8 F (37.1 C)   Ht 5\' 9"  (1.753 m)   Wt 125 lb 3.2 oz (56.8 kg)   SpO2 98%   BMI 18.49 kg/m  General: no acute distress , A&Ox3 Psych: Very anxious HEENT: PEERL, conjunctiva normal, neck is supple Cardiovascular:  RRR without murmur or gallop.  Respiratory:  Good breath sounds bilaterally, CTAB with normal respiratory effort Skin:  Warm, no rashes  Commons side effects, risks, benefits, and alternatives for medications and treatment plan prescribed today were discussed, and the patient expressed understanding of the given instructions. Patient is instructed to call or message via MyChart if he/she has any questions or concerns regarding our treatment plan. No barriers to understanding were identified. We discussed Red Flag symptoms and signs in detail. Patient expressed understanding regarding what to do in case of urgent or emergency  type symptoms.  Medication list was reconciled, printed and provided to the patient in AVS. Patient instructions and summary information was reviewed with the patient as documented in the AVS. This note was prepared with assistance of Dragon voice recognition software. Occasional wrong-word or sound-a-like substitutions may have occurred due to the inherent limitations of voice recognition software  This visit occurred during the SARS-CoV-2 public health emergency.  Safety protocols were in place, including screening questions prior to the visit, additional usage of staff PPE, and extensive cleaning of exam room while observing appropriate contact time as indicated for disinfecting solutions.

## 2022-02-24 LAB — ANA: Anti Nuclear Antibody (ANA): NEGATIVE

## 2022-06-01 ENCOUNTER — Encounter: Payer: Self-pay | Admitting: Family Medicine

## 2022-09-12 ENCOUNTER — Other Ambulatory Visit (HOSPITAL_COMMUNITY)
Admission: RE | Admit: 2022-09-12 | Discharge: 2022-09-12 | Disposition: A | Discharge status: Home / Self Care | Payer: Commercial Managed Care - HMO | Source: Ambulatory Visit | Attending: Family Medicine | Admitting: Family Medicine

## 2022-09-12 ENCOUNTER — Encounter: Payer: Self-pay | Admitting: Family Medicine

## 2022-09-12 ENCOUNTER — Ambulatory Visit: Payer: Commercial Managed Care - HMO | Admitting: Family Medicine

## 2022-09-12 VITALS — BP 98/70 | HR 115 | Temp 98.7°F | Ht 69.0 in | Wt 128.4 lb

## 2022-09-12 DIAGNOSIS — T8389XA Other specified complication of genitourinary prosthetic devices, implants and grafts, initial encounter: Secondary | ICD-10-CM | POA: Diagnosis present

## 2022-09-12 DIAGNOSIS — Z975 Presence of (intrauterine) contraceptive device: Secondary | ICD-10-CM

## 2022-09-12 DIAGNOSIS — F321 Major depressive disorder, single episode, moderate: Secondary | ICD-10-CM | POA: Diagnosis not present

## 2022-09-12 DIAGNOSIS — N200 Calculus of kidney: Secondary | ICD-10-CM

## 2022-09-12 DIAGNOSIS — N920 Excessive and frequent menstruation with regular cycle: Secondary | ICD-10-CM

## 2022-09-12 DIAGNOSIS — F411 Generalized anxiety disorder: Secondary | ICD-10-CM | POA: Diagnosis not present

## 2022-09-12 DIAGNOSIS — R399 Unspecified symptoms and signs involving the genitourinary system: Secondary | ICD-10-CM | POA: Diagnosis not present

## 2022-09-12 LAB — CBC WITH DIFFERENTIAL/PLATELET
Basophils Absolute: 0 10*3/uL (ref 0.0–0.1)
Basophils Relative: 0.5 % (ref 0.0–3.0)
Eosinophils Absolute: 0 10*3/uL (ref 0.0–0.7)
Eosinophils Relative: 0.5 % (ref 0.0–5.0)
HCT: 40.1 % (ref 36.0–46.0)
Hemoglobin: 13.8 g/dL (ref 12.0–15.0)
Lymphocytes Relative: 21.8 % (ref 12.0–46.0)
Lymphs Abs: 1.4 10*3/uL (ref 0.7–4.0)
MCHC: 34.3 g/dL (ref 30.0–36.0)
MCV: 95 fl (ref 78.0–100.0)
Monocytes Absolute: 0.6 10*3/uL (ref 0.1–1.0)
Monocytes Relative: 9.4 % (ref 3.0–12.0)
Neutro Abs: 4.4 10*3/uL (ref 1.4–7.7)
Neutrophils Relative %: 67.8 % (ref 43.0–77.0)
Platelets: 310 10*3/uL (ref 150.0–400.0)
RBC: 4.22 Mil/uL (ref 3.87–5.11)
RDW: 12.7 % (ref 11.5–14.6)
WBC: 6.5 10*3/uL (ref 4.5–10.5)

## 2022-09-12 LAB — TSH: TSH: 1.29 u[IU]/mL (ref 0.35–5.50)

## 2022-09-12 LAB — POCT URINE PREGNANCY: Preg Test, Ur: NEGATIVE

## 2022-09-12 NOTE — Patient Instructions (Signed)
Please return in 3-6 months for your annual complete physical; please come fasting.   I will release your lab results to you on your MyChart account with further instructions. You may see the results before I do, but when I review them I will send you a message with my report or have my assistant call you if things need to be discussed. Please reply to my message with any questions. Thank you!   Please call your GYN to further address your prolonged menstrual cycle that may be due to your IUD.   If you have any questions or concerns, please don't hesitate to send me a message via MyChart or call the office at [phone]. Thank you for visiting with [place] today! It's our pleasure caring for you.

## 2022-09-12 NOTE — Progress Notes (Signed)
Subjective  CC:  Chief Complaint  Patient presents with   ongoing menstruals    Pt stated that she has had an ongoing menstrual sine the beginning of Nov and it has not stopped and having some lower stomach and back pain.    HPI: Laura Wilkins is a 21 y.o. female who presents to the office today to address the problems listed above in the chief complaint. 21 year old presents due to 4 to 5-week ongoing menstrual cycle.  She reports she had a [place] IUD placed back in July.  Did well.  September cycle was on time but she did have some intermenstrual spotting then.  October November cycles were on time.  However persistently bleeding.  Light to moderately daily.  No vaginal discharge.  Using tampons.  Having some increased abdominal lower cramping but no severe pelvic pain.  No fevers chills or discharge.  She is sexually active.  No history of STDs.  She does not use condoms.  She also reports she has intermittent lower abdominal pain that she thinks is related to kidney stones.  See last note.  She never initiated care with a gastroenterologist for her abdominal pain.  See prior notes. Anxiety depression: Active.  Seeing psychiatrist.  Started on sertraline.  No longer using Focalin.  She will have a full ADD evaluation in January. Reports history of vaginal irritation itching and dysuria few weeks ago.  Symptoms have resolved.  Assessment  1. Menorrhagia due to intrauterine device (IUD) (HCC)   2. GAD (generalized anxiety disorder)   3. Urinary symptom or sign   4. Depression, major, single episode, moderate (HCC) Chronic  5. IUD (intrauterine device) in place      Plan  Menorrhagia with IUD: Likely hormonal and related.  Will rule out STD infection today.  Check for anemia.  Recommend follow-up with GYN to further manage her persistent bleeding.  Urine pregnancy pending Urinary symptoms: Likely had yeast vaginitis.  Education given.  We will recheck for STDs, yeast, BV and urine  culture.  Reassured. History of nephrolithiasis: Patient to follow-up if has recurrent symptoms Continue follow-up with psychiatry.  On sertraline.  Follow up: 3 to 6 months for complete physical Visit date not found  Orders Placed This Encounter  Procedures   Urine Culture   CBC with Differential/Platelet   TSH   POCT urine pregnancy   No orders of the defined types were placed in this encounter.     I reviewed the patients updated PMH, FH, and SocHx.    Patient Active Problem List   Diagnosis Date Noted   IUD (intrauterine device) in place 09/12/2022   Raynaud's phenomenon without gangrene 02/21/2022   Attention deficit disorder (ADD) without hyperactivity 02/21/2022   Nephrolithiasis 02/21/2022   Chronic upper abdominal pain 02/21/2022   GAD (generalized anxiety disorder) 10/04/2021   Acne vulgaris 11/30/2018   Depression, major, single episode, moderate (HCC) 11/30/2018   History of concussion 07/10/2018   Current Meds  Medication Sig   FLONASE SENSIMIST 27.5 MCG/SPRAY nasal spray 2 sprays daily.   hydrOXYzine (VISTARIL) 25 MG capsule Take 25-50 mg by mouth every 6 (six) hours as needed.   sertraline (ZOLOFT) 25 MG tablet Take 50 mg by mouth daily.   triamcinolone cream (KENALOG) 0.1 % Apply 1 application topically 2 (two) times daily. For 2 weeks, then as needed    Allergies: Patient has No Known Allergies. Family History: Patient family history is not on file. Social History:  Patient  reports  that she has never smoked. She has never used smokeless tobacco. She reports that she does not drink alcohol and does not use drugs.  Review of Systems: Constitutional: Negative for fever malaise or anorexia Cardiovascular: negative for chest pain Respiratory: negative for SOB or persistent cough Gastrointestinal: negative for abdominal pain  Objective  Vitals: BP 98/70   Pulse (!) 115   Temp 98.7 F (37.1 C)   Ht 5\' 9"  (1.753 m)   Wt 128 lb 6.4 oz (58.2 kg)    SpO2 98%   BMI 18.96 kg/m  General: no acute distress , A&Ox3 Psych: Anxious appearing HEENT: PEERL, conjunctiva normal, neck is supple Cardiovascular:  RRR without murmur or gallop.  Respiratory:  Good breath sounds bilaterally, CTAB with normal respiratory effort Abdomen, soft, nontender, scaphoid, normal bowel sounds, no masses Skin:  Warm, no rashes    Commons side effects, risks, benefits, and alternatives for medications and treatment plan prescribed today were discussed, and the patient expressed understanding of the given instructions. Patient is instructed to call or message via MyChart if he/she has any questions or concerns regarding our treatment plan. No barriers to understanding were identified. We discussed Red Flag symptoms and signs in detail. Patient expressed understanding regarding what to do in case of urgent or emergency type symptoms.  Medication list was reconciled, printed and provided to the patient in AVS. Patient instructions and summary information was reviewed with the patient as documented in the AVS. This note was prepared with assistance of Dragon voice recognition software. Occasional wrong-word or sound-a-like substitutions may have occurred due to the inherent limitations of voice recognition software  This visit occurred during the SARS-CoV-2 public health emergency.  Safety protocols were in place, including screening questions prior to the visit, additional usage of staff PPE, and extensive cleaning of exam room while observing appropriate contact time as indicated for disinfecting solutions.

## 2022-09-14 LAB — CERVICOVAGINAL ANCILLARY ONLY
Bacterial Vaginitis (gardnerella): NEGATIVE
Candida Glabrata: NEGATIVE
Candida Vaginitis: POSITIVE — AB
Chlamydia: NEGATIVE
Comment: NEGATIVE
Comment: NEGATIVE
Comment: NEGATIVE
Comment: NEGATIVE
Comment: NEGATIVE
Comment: NORMAL
Neisseria Gonorrhea: NEGATIVE
Trichomonas: NEGATIVE

## 2022-09-15 LAB — URINE CULTURE
MICRO NUMBER:: 14278664
SPECIMEN QUALITY:: ADEQUATE

## 2022-10-17 ENCOUNTER — Telehealth: Payer: Commercial Managed Care - HMO | Admitting: Nurse Practitioner

## 2022-10-17 DIAGNOSIS — N3000 Acute cystitis without hematuria: Secondary | ICD-10-CM

## 2022-10-17 NOTE — Patient Instructions (Signed)
Based on what you shared with me, I feel your condition warrants further evaluation as soon as possible at an Emergency department.  ? ?  ?If you are having a true medical emergency please call 911.   ?  ? ?Emergency Department-[facility]  ?Get Driving Directions  ?[phone]  ?[address]  ?[place], [place] [address]  ?Open 24/7/365  ?  ?  ?[facility] [address]  ?Get Driving Directions  ?[address]  ?[place], [place] [address]  ?Open 24/7/365  ?  ?Emergency Department- [facility]  ?Get Driving Directions  ?[phone]  ?[address]  ?[place], [place] [address]  ?Open 24/7/365  ?  ?  ?[facility]  ?Get Driving Directions  ?[phone]  ?[address]  ?[place], [place] [address]  ?Open 24/7/365  ?  ?[place]  ?Emergency Department- [facility]  ?Get Driving Directions  ?[phone]  ?[address]  ?[place], [place] [address]  ?Open 24/7/365  ?  ?[place]  ?Emergency Department- [facility] [place]  ?Get Driving Directions  ?[address]  ?[place], [place] [address]  ?Open 24/7/365  ?  ?[place]  ?Emergency Department- [facility]  ?Get Driving Directions  ?[phone]  ?[address]  ?[place],  [address]  ?Open 24/7/365  ?  ?

## 2022-10-17 NOTE — Progress Notes (Signed)
Virtual Visit Consent   Laura Wilkins, you are scheduled for a virtual visit with a [facility] provider today. Just as with appointments in the office, your consent must be obtained to participate. Your consent will be active for this visit and any virtual visit you may have with one of our providers in the next 365 days. If you have a MyChart account, a copy of this consent can be sent to you electronically.  As this is a virtual visit, video technology does not allow for your provider to perform a traditional examination. This may limit your provider's ability to fully assess your condition. If your provider identifies any concerns that need to be evaluated in person or the need to arrange testing (such as labs, EKG, etc.), we will make arrangements to do so. Although advances in technology are sophisticated, we cannot ensure that it will always work on either your end or our end. If the connection with a video visit is poor, the visit may have to be switched to a telephone visit. With either a video or telephone visit, we are not always able to ensure that we have a secure connection.  By engaging in this virtual visit, you consent to the provision of healthcare and authorize for your insurance to be billed (if applicable) for the services provided during this visit. Depending on your insurance coverage, you may receive a charge related to this service.  I need to obtain your verbal consent now. Are you willing to proceed with your visit today? Laura Wilkins has provided verbal consent on 10/17/2022 for a virtual visit (video or telephone). Apolonio Schneiders, FNP  Date: 10/17/2022 6:35 PM  Virtual Visit via Video Note   I, Apolonio Schneiders, connected with  Laura Wilkins  ([id number], Jan 16, 2001) on 10/17/22 at  6:45 PM EST by a video-enabled telemedicine application and verified that I am speaking with the correct person using two identifiers.  Location: Patient: Virtual Visit Location Patient:  Home Provider: Virtual Visit Location Provider: Home Office   I discussed the limitations of evaluation and management by telemedicine and the availability of in person appointments. The patient expressed understanding and agreed to proceed.   CC:  "Severe back pain, fever, vomiting. I had kidney stones before but this pain is lasting was longer and i keep any food or water down."   History of Present Illness: Laura Wilkins is a 22 y.o. who identifies as a female who was assigned female at birth, and is being seen today for back pain.  Pain on right side of her back, thought initially it was from sleeping wrong.  She has started to have abdominal spasms she relates to having kidney stones in the past (2022)  In class today her back started to hurt very bad and it radiated to her abdomen.   She has had the urgency to void throughout the day without the ability to fully void   Prior to leaving school today (current college student at [employer]) she vomited and she has been vomiting since returning home this morning throughout the day. Has not been able to keep liquids down  She had a urine culture performed in the office 09/12/22 with e.coli & strep agalactiae:     Component 1 mo ago  MICRO NUMBER: [id number]  SPECIMEN QUALITY: Adequate  Sample Source URINE  STATUS: FINAL  ISOLATE 1: Escherichia coli Abnormal   Comment: 10,000-49,000 CFU/mL of Escherichia coli  ISOLATE 2: Streptococcus agalactiae Abnormal   Comment: 10,000-49,000 CFU/mL  of Group B Streptococcus isolated Beta-hemolytic streptococci are predictably susceptible to Penicillin and other beta-lactams. Susceptibility testing not routinely performed. Please contact the laboratory within 3 days if susceptibility testing is desired. Erythromycin and clindamycin are not recommended for treatment of urinary tract infections, but clindamycin may be useful for treatment of rectovaginal colonization or infection. Any amount of group B  Streptococcus in urine specimens obtained from pregnant females is a marker of genital tract colonization. If this patient is pregnant, please refer to ACOG guidelines for appropriate screening and management of pregnant women.  Resulting Agency [facility] [place]     Susceptibility   Escherichia coli    URINE CULTURE, REFLEX    AMOX/CLAVULANIC <=2 Sensitive    AMPICILLIN 4 Sensitive    AMPICILLIN/SULBACTAM <=2 Sensitive    CEFAZOLIN <=4 Not Reportable 1    CEFEPIME <=1 Sensitive    CEFTAZIDIME <=1 Sensitive    CEFTRIAXONE <=1 Sensitive    CIPROFLOXACIN <=0.25 Sensitive    GENTAMICIN <=1 Sensitive    IMIPENEM <=0.25 Sensitive    LEVOFLOXACIN <=0.12 Sensitive    NITROFURANTOIN <=16 Sensitive    PIP/TAZO <=4 Sensitive    TOBRAMYCIN <=1 Sensitive    TRIMETH/SULFA <=20 Sensitive 2           Patient does not remember being started on an antibiotic after that  Problems:  Patient Active Problem List   Diagnosis Date Noted   IUD (intrauterine device) in place 09/12/2022   Raynaud's phenomenon without gangrene 02/21/2022   Attention deficit disorder (ADD) without hyperactivity 02/21/2022   Nephrolithiasis 02/21/2022   Chronic upper abdominal pain 02/21/2022   GAD (generalized anxiety disorder) 10/04/2021   Acne vulgaris 11/30/2018   Depression, major, single episode, moderate (HCC) 11/30/2018   History of concussion 07/10/2018    Allergies: No Known Allergies Medications:  Current Outpatient Medications:    dexmethylphenidate (FOCALIN XR) 20 MG 24 hr capsule, Take 20 mg by mouth daily., Disp: , Rfl:    drospirenone-ethinyl estradiol (YAZ) 3-0.02 MG tablet, , Disp: , Rfl:    hydrOXYzine (ATARAX) 25 MG tablet, Take 12.5-25 mg by mouth every 6 (six) hours as needed., Disp: , Rfl:    raltegravir (ISENTRESS) 400 MG tablet, Take by mouth., Disp: , Rfl:    FLONASE SENSIMIST 27.5 MCG/SPRAY nasal spray, 2 sprays daily., Disp: , Rfl:    hydrOXYzine (VISTARIL) 25 MG capsule,  Take 25-50 mg by mouth every 6 (six) hours as needed., Disp: , Rfl:    sertraline (ZOLOFT) 25 MG tablet, Take 50 mg by mouth daily., Disp: , Rfl:    triamcinolone cream (KENALOG) 0.1 %, Apply 1 application topically 2 (two) times daily. For 2 weeks, then as needed, Disp: 45 g, Rfl: 0  Observations/Objective: Patient is well-developed, well-nourished  Patient appears visibly uncomfortable  Head is normocephalic, atraumatic.  No labored breathing.  Speech is clear and coherent with logical content.  Patient is alert and oriented at baseline.    Assessment and Plan: 1. Acute cystitis without hematuria High risk for pyelonephritis advised ED visit tonight  Patient has transportation, advised against driving alone  She will call parents for support  Appears to have untreated UTI from December      Follow Up Instructions: I discussed the assessment and treatment plan with the patient. The patient was provided an opportunity to ask questions and all were answered. The patient agreed with the plan and demonstrated an understanding of the instructions.  A copy of instructions were sent to the patient  via MyChart unless otherwise noted below.    The patient was advised to call back or seek an in-person evaluation if the symptoms worsen or if the condition fails to improve as anticipated.  Time:  I spent 10 minutes with the patient via telehealth technology discussing the above problems/concerns.    Apolonio Schneiders, FNP

## 2022-10-19 ENCOUNTER — Telehealth: Payer: Self-pay

## 2022-10-19 NOTE — Telephone Encounter (Signed)
Spoke with patient, offered ER f/u. Patient states she is back at school. Will discuss with mom if f/u appt is needed and will give [place] a call back.

## 2023-07-26 ENCOUNTER — Ambulatory Visit (INDEPENDENT_AMBULATORY_CARE_PROVIDER_SITE_OTHER): Payer: Managed Care, Other (non HMO) | Admitting: Family Medicine

## 2023-07-26 VITALS — BP 96/60 | HR 81 | Temp 98.4°F | Ht 69.0 in | Wt 127.4 lb

## 2023-07-26 DIAGNOSIS — L700 Acne vulgaris: Secondary | ICD-10-CM | POA: Diagnosis not present

## 2023-07-26 DIAGNOSIS — Z79899 Other long term (current) drug therapy: Secondary | ICD-10-CM

## 2023-07-26 LAB — CBC WITH DIFFERENTIAL/PLATELET
Basophils Absolute: 0 10*3/uL (ref 0.0–0.1)
Basophils Relative: 0.6 % (ref 0.0–3.0)
Eosinophils Absolute: 0 10*3/uL (ref 0.0–0.7)
Eosinophils Relative: 0.6 % (ref 0.0–5.0)
HCT: 40.8 % (ref 36.0–46.0)
Hemoglobin: 13.4 g/dL (ref 12.0–15.0)
Lymphocytes Relative: 28.2 % (ref 12.0–46.0)
Lymphs Abs: 1.8 10*3/uL (ref 0.7–4.0)
MCHC: 32.9 g/dL (ref 30.0–36.0)
MCV: 97.6 fL (ref 78.0–100.0)
Monocytes Absolute: 0.5 10*3/uL (ref 0.1–1.0)
Monocytes Relative: 7.8 % (ref 3.0–12.0)
Neutro Abs: 4 10*3/uL (ref 1.4–7.7)
Neutrophils Relative %: 62.8 % (ref 43.0–77.0)
Platelets: 247 10*3/uL (ref 150.0–400.0)
RBC: 4.18 Mil/uL (ref 3.87–5.11)
RDW: 12.4 % (ref 11.5–15.5)
WBC: 6.3 10*3/uL (ref 4.0–10.5)

## 2023-07-26 LAB — LIPID PANEL
Cholesterol: 121 mg/dL (ref 0–200)
HDL: 56.2 mg/dL (ref >39.00)
LDL Cholesterol: 50 mg/dL (ref 0–99)
NonHDL: 64.59
Total CHOL/HDL Ratio: 2
Triglycerides: 75 mg/dL (ref 0.0–149.0)
VLDL: 15 mg/dL (ref 0.0–40.0)

## 2023-07-26 LAB — COMPREHENSIVE METABOLIC PANEL
ALT: 10 U/L (ref 0–35)
AST: 15 U/L (ref 0–37)
Albumin: 3.9 g/dL (ref 3.5–5.2)
Alkaline Phosphatase: 59 U/L (ref 39–117)
BUN: 12 mg/dL (ref 6–23)
CO2: 27 meq/L (ref 19–32)
Calcium: 9.2 mg/dL (ref 8.4–10.5)
Chloride: 103 meq/L (ref 96–112)
Creatinine, Ser: 0.64 mg/dL (ref 0.40–1.20)
GFR: 126.49 mL/min (ref >60.00)
Glucose, Bld: 98 mg/dL (ref 70–99)
Potassium: 3.7 meq/L (ref 3.5–5.1)
Sodium: 138 meq/L (ref 135–145)
Total Bilirubin: 0.8 mg/dL (ref 0.2–1.2)
Total Protein: 6.9 g/dL (ref 6.0–8.3)

## 2023-07-26 NOTE — Progress Notes (Signed)
Subjective  CC:  Chief Complaint  Patient presents with   lab work    Pt is getting ready to start some new medication Accutane but need labs done before she can start it.     HPI: Laura Wilkins is a 22 y.o. female who presents to the office today to address the problems listed above in the chief complaint. 22 year old here for lab work per dermatologist for Accutane.  Assessment  1. Pharmacologic therapy   2. High risk medication use   3. Acne vulgaris      Plan  Acne, high risk medication: Ordered lab work and will send to dermatologist.  Patient to go to [facility] in future for monthly draws.  She understands. Recommend annual physical scheduled.  Needs Pap smear and immunizations as well  Follow up: Complete physical, patient to schedule Visit date not found  Orders Placed This Encounter  Procedures   CBC with Differential/Platelet   Comp Met (CMET)   Lipid Profile   Beta hCG quant (ref lab)   No orders of the defined types were placed in this encounter.     I reviewed the patients updated PMH, FH, and SocHx.    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   drospirenone-ethinyl estradiol (YAZ) 3-0.02 MG tablet    FLONASE SENSIMIST 27.5 MCG/SPRAY nasal spray 2 sprays daily.   hydrOXYzine (ATARAX) 25 MG tablet Take 12.5-25 mg by mouth every 6 (six) hours as needed.   hydrOXYzine (VISTARIL) 25 MG capsule Take 25-50 mg by mouth every 6  (six) hours as needed.   raltegravir (ISENTRESS) 400 MG tablet Take by mouth.   sertraline (ZOLOFT) 25 MG tablet Take 50 mg by mouth daily.   triamcinolone cream (KENALOG) 0.1 % Apply 1 application topically 2 (two) times daily. For 2 weeks, then as needed    Allergies: Patient has No Known Allergies. Family History: Patient family history is not on file. Social History:  Patient  reports that she has never smoked. She has never used smokeless tobacco. She reports that she does not drink alcohol and does not use drugs.  Review of Systems: Constitutional: Negative for fever malaise or anorexia Cardiovascular: negative for chest pain Respiratory: negative for SOB or persistent cough Gastrointestinal: negative for abdominal pain  Objective  Vitals: BP 96/60   Pulse 81   Temp 98.4 F (36.9 C)   Ht 5\' 9"  (1.753 m)   Wt 127 lb 6.4 oz (57.8 kg)   SpO2 98%   BMI 18.81 kg/m  General: no acute distress , A&Ox3   Commons side effects, risks, benefits, and alternatives for medications and treatment plan prescribed today were discussed, and the patient expressed understanding of the given instructions. Patient is instructed to call or message via MyChart if he/she has any questions or concerns regarding our treatment plan. No barriers to understanding were identified. We discussed Red Flag symptoms and signs in detail. Patient expressed understanding regarding what to do in case of urgent or  emergency type symptoms.  Medication list was reconciled, printed and provided to the patient in AVS. Patient instructions and summary information was reviewed with the patient as documented in the AVS. This note was prepared with assistance of Dragon voice recognition software. Occasional wrong-word or sound-a-like substitutions may have occurred due to the inherent limitations of voice recognition software

## 2023-07-27 LAB — BETA HCG QUANT (REF LAB): hCG Quant: <1 m[IU]/mL

## 2023-07-27 NOTE — Progress Notes (Signed)
See mychart note  Please fax results to her dermatologist: Dr. Pollyann Kennedy

## 2024-03-25 ENCOUNTER — Ambulatory Visit: Admitting: Physician Assistant

## 2024-03-25 ENCOUNTER — Encounter: Payer: Self-pay | Admitting: Physician Assistant

## 2024-03-25 VITALS — BP 106/70 | HR 86 | Temp 98.1°F | Ht 69.0 in | Wt 131.4 lb

## 2024-03-25 DIAGNOSIS — J029 Acute pharyngitis, unspecified: Secondary | ICD-10-CM

## 2024-03-25 LAB — POCT RAPID STREP A (OFFICE): Rapid Strep A Screen: POSITIVE — AB

## 2024-03-25 MED ORDER — AMOXICILLIN-POT CLAVULANATE 875-125 MG PO TABS: 1.0000 | ORAL_TABLET | Freq: Two times a day (BID) | ORAL | 0 refills | Status: AC

## 2024-03-25 NOTE — Patient Instructions (Signed)
 It was great to see you!  You have strep throat, please take antibiotic as prescribed If any symptoms do not fully resolve please follow-up with Dr. Jonelle Neri   Take care,  Alexander Iba PA-C

## 2024-03-25 NOTE — Progress Notes (Signed)
 Laura Wilkins is a 23 y.o. female here for a new problem.  History of Present Illness:   Chief Complaint  Patient presents with   Cough    Pt c/o cough and sore throat, x 4 weeks, past 1.5 weeks nasal congestion yellow/green drainage. Coughing some. Fever 100.4 x 1.    HPI  Sore throat Pt complains of sore throat and associated cough starting 4 weeks ago.  She also complains of nasal congestion with yellow/green drainage for the past 1.5 weeks and fever of 100.4 for 1 day. Pt has a positive strep test in office today. She reports some ear pain, headaches, and fatigue. Per pt, the headaches have now resolved but were worse than normal. Pt complains of lower back pain and discomfort. Notes hx of kidney stones. Denies burning while urinating or increased urinary frequency.  Past Medical History:  Diagnosis Date   Concussion      Social History   Tobacco Use   Smoking status: Never   Smokeless tobacco: Never  Substance Use Topics   Alcohol use: No   Drug use: No    No past surgical history on file.  No family history on file.  No Known Allergies  Current Medications:   Current Outpatient Medications:    amoxicillin-clavulanate (AUGMENTIN) 875-125 MG tablet, Take 1 tablet by mouth 2 (two) times daily., Disp: 20 tablet, Rfl: 0   Clindamycin-Benzoyl Per, Refr, gel, Apply 1 Application topically 2 (two) times daily., Disp: , Rfl:    hydrOXYzine (VISTARIL) 25 MG capsule, Take 25-50 mg by mouth every 6 (six) hours as needed., Disp: , Rfl:    levonorgestrel (KYLEENA) 19.5 MG IUD, 1 each by Intrauterine route once., Disp: , Rfl:    dexmethylphenidate (FOCALIN XR) 20 MG 24 hr capsule, Take 20 mg by mouth daily., Disp: , Rfl:    drospirenone-ethinyl estradiol  (YAZ) 3-0.02 MG tablet, , Disp: , Rfl:    FLONASE SENSIMIST 27.5 MCG/SPRAY nasal spray, 2 sprays daily. (Patient not taking: Reported on 03/25/2024), Disp: , Rfl:    Review of Systems:   Negative unless otherwise  specified per HPI.  Vitals:   Vitals:   03/25/24 1057  BP: 106/70  Pulse: 86  Temp: 98.1 F (36.7 C)  TempSrc: Temporal  SpO2: 97%  Weight: 131 lb 6.1 oz (59.6 kg)  Height: 5' 9 (1.753 m)     Body mass index is 19.4 kg/m.  Physical Exam:   Physical Exam Vitals and nursing note reviewed.  Constitutional:      General: She is not in acute distress.    Appearance: She is well-developed. She is not ill-appearing or toxic-appearing.  HENT:     Head: Normocephalic and atraumatic.     Right Ear: Ear canal and external ear normal. There is impacted cerumen. Tympanic membrane is not erythematous, retracted or bulging.     Left Ear: Ear canal and external ear normal. There is impacted cerumen. Tympanic membrane is not erythematous, retracted or bulging.     Nose: Nose normal.     Right Sinus: No maxillary sinus tenderness or frontal sinus tenderness.     Left Sinus: No maxillary sinus tenderness or frontal sinus tenderness.     Mouth/Throat:     Pharynx: Uvula midline. Posterior oropharyngeal erythema present.   Eyes:     General: Lids are normal.     Conjunctiva/sclera: Conjunctivae normal.   Neck:     Trachea: Trachea normal.   Cardiovascular:     Rate and Rhythm:  Normal rate and regular rhythm.     Heart sounds: Normal heart sounds, S1 normal and S2 normal.  Pulmonary:     Effort: Pulmonary effort is normal.     Breath sounds: Normal breath sounds. No decreased breath sounds, wheezing, rhonchi or rales.  Lymphadenopathy:     Cervical: No cervical adenopathy.   Skin:    General: Skin is warm and dry.   Neurological:     Mental Status: She is alert.   Psychiatric:        Speech: Speech normal.        Behavior: Behavior normal. Behavior is cooperative.    Results for orders placed or performed in visit on 03/25/24  POCT rapid strep A  Result Value Ref Range   Rapid Strep A Screen Positive (A) Negative    Assessment and Plan:   1. Sore throat (Primary) -  POCT rapid strep A No red flags on exam.   Will initiate amoxicillin per orders.  Discussed taking medications as prescribed.  Reviewed return precautions including new or worsening fever, SOB, new or worsening cough or other concerns.  Push fluids and rest.  I recommend that patient follow-up if symptoms worsen or persist despite treatment x 7-10 days, sooner if needed.   I, Timoteo Force, acting as a Neurosurgeon for [employer], [place]., have documented all relevant documentation on the behalf of Alexander Iba, [place], as directed by  Alexander Iba, PA while in the presence of Alexander Iba, [place].  I, Alexander Iba, [place], have reviewed all documentation for this visit. The documentation on 03/25/24 for the exam, diagnosis, procedures, and orders are all accurate and complete.  Alexander Iba, PA-C

## 2024-05-11 ENCOUNTER — Ambulatory Visit: Payer: Self-pay

## 2024-05-11 NOTE — Telephone Encounter (Signed)
 FYI Only or Action Required?: FYI only for provider.  Patient was last seen in primary care on 03/25/2024 by Job Lukes, PA.  Called Nurse Triage reporting Sore Throat.  Symptoms began several days ago.  Interventions attempted: Nothing.  Symptoms are: unchanged.  Triage Disposition: See Physician Within 24 Hours  Patient/caregiver understands and will follow disposition?: Yes     Summary:  patient has moved to [place], [place] patient has sore throat, headache, nose clogged, ears pressure.  Patient did not clean retainers as told to do by the provider, after last sore throat issue. Patient did change tooth brush for new one. Patient finished course of medication.   Patient would like to nurse or Dr Jodie about getting the same medication as before, because this is recurring issue.  Last office visit 03/25/24   Reason for Disposition  SEVERE throat pain (e.g., excruciating)  Answer Assessment - Initial Assessment Questions Patient says she had strep throat in June, didn't clean retainers and now she's having the same symptoms as before with the sore throat, nasal congestion, headache, ears feeling full. She's wanting to know if Dr. Jodie will just send in the same medication she was prescribed in June for the strep throat. Advised since she's now in [facility] and the providers would not be able to see her virtually being in [facility], that she will need to go to an UC close to her to be seen. She verbalized understanding and says she will go.   1. ONSET: When did the throat start hurting? (Hours or days ago)      2 days ago 2. SEVERITY: How bad is the sore throat? (Scale 1-10; mild, moderate or severe)     4  3. STREP EXPOSURE: Has there been any exposure to strep within the past week? If Yes, ask: What type of contact occurred?      Placed in retainer 3 nights ago and it wasn't cleaned after diagnosis with strep, no exposure 4.  VIRAL SYMPTOMS: Are there any symptoms of a cold, such  as a runny nose, cough, hoarse voice or red eyes?      Yes, runny nose; congestion 5. FEVER: Do you have a fever? If Yes, ask: What is your temperature, how was it measured, and when did it start?     No 6. PUS ON THE TONSILS: Is there pus on the tonsils in the back of your throat?     No 7. OTHER SYMPTOMS: Do you have any other symptoms? (e.g., difficulty breathing, headache, rash)     Headache 9/10, clogged ears, nasal congestion 8. PREGNANCY: Is there any chance you are pregnant? When was your last menstrual period?     No  Protocols used: Sore Throat-A-AH
